# Patient Record
Sex: Male | Born: 1970 | Race: Black or African American | Hispanic: No | Marital: Married | Smoking: Never smoker
Health system: Southern US, Community
[De-identification: ages and names within clinical notes are randomized; demographics above are authoritative.]

## PROBLEM LIST (undated history)

## (undated) DIAGNOSIS — I10 Essential (primary) hypertension: Secondary | ICD-10-CM

## (undated) HISTORY — PX: HERNIA REPAIR: SHX51

---

## 2007-01-08 ENCOUNTER — Ambulatory Visit: Payer: Self-pay | Admitting: Emergency Medicine

## 2007-03-24 ENCOUNTER — Ambulatory Visit: Payer: Self-pay | Admitting: Family Medicine

## 2007-06-03 ENCOUNTER — Emergency Department: Payer: Self-pay | Admitting: Emergency Medicine

## 2008-10-31 ENCOUNTER — Ambulatory Visit: Payer: Self-pay | Admitting: Internal Medicine

## 2013-12-22 ENCOUNTER — Ambulatory Visit: Payer: Self-pay | Admitting: Family Medicine

## 2014-10-19 ENCOUNTER — Ambulatory Visit
Admission: EM | Admit: 2014-10-19 | Discharge: 2014-10-19 | Disposition: A | Discharge status: Home / Self Care | Payer: Federal, State, Local not specified - PPO | Attending: Family Medicine | Admitting: Family Medicine

## 2014-10-19 DIAGNOSIS — B349 Viral infection, unspecified: Secondary | ICD-10-CM | POA: Diagnosis not present

## 2014-10-19 DIAGNOSIS — R51 Headache: Secondary | ICD-10-CM | POA: Diagnosis not present

## 2014-10-19 DIAGNOSIS — J988 Other specified respiratory disorders: Secondary | ICD-10-CM

## 2014-10-19 DIAGNOSIS — H109 Unspecified conjunctivitis: Secondary | ICD-10-CM

## 2014-10-19 DIAGNOSIS — B9789 Other viral agents as the cause of diseases classified elsewhere: Secondary | ICD-10-CM

## 2014-10-19 DIAGNOSIS — I10 Essential (primary) hypertension: Secondary | ICD-10-CM | POA: Insufficient documentation

## 2014-10-19 DIAGNOSIS — R519 Headache, unspecified: Secondary | ICD-10-CM

## 2014-10-19 HISTORY — DX: Essential (primary) hypertension: I10

## 2014-10-19 LAB — RAPID STREP SCREEN (MED CTR MEBANE ONLY): Streptococcus, Group A Screen (Direct): NEGATIVE

## 2014-10-19 MED ORDER — KETOROLAC TROMETHAMINE 60 MG/2ML IM SOLN
60.0000 mg | Freq: Once | INTRAMUSCULAR | Status: AC
  Administered 2014-10-19: 60 mg via INTRAMUSCULAR

## 2014-10-19 MED ORDER — FLUTICASONE PROPIONATE 50 MCG/ACT NA SUSP: 1.0000 | Freq: Two times a day (BID) | NASAL | Status: AC

## 2014-10-19 MED ORDER — SALINE SPRAY 0.65 % NA SOLN: 1.0000 | NASAL | Status: AC | PRN

## 2014-10-19 MED ORDER — ACETAMINOPHEN 500 MG PO TABS: 500.0000 mg | ORAL_TABLET | Freq: Four times a day (QID) | ORAL | Status: AC | PRN

## 2014-10-19 NOTE — Discharge Instructions (Signed)
Hypertension °Hypertension, commonly called high blood pressure, is when the force of blood pumping through your arteries is too strong. Your arteries are the blood vessels that carry blood from your heart throughout your body. A blood pressure reading consists of a higher number over a lower number, such as 110/72. The higher number (systolic) is the pressure inside your arteries when your heart pumps. The lower number (diastolic) is the pressure inside your arteries when your heart relaxes. Ideally you want your blood pressure below 120/80. °Hypertension forces your heart to work harder to pump blood. Your arteries may become narrow or stiff. Having hypertension puts you at risk for heart disease, stroke, and other problems.  °RISK FACTORS °Some risk factors for high blood pressure are controllable. Others are not.  °Risk factors you cannot control include:  °· Race. You may be at higher risk if you are African American. °· Age. Risk increases with age. °· Gender. Men are at higher risk than women before age 45 years. After age 65, women are at higher risk than men. °Risk factors you can control include: °· Not getting enough exercise or physical activity. °· Being overweight. °· Getting too much fat, sugar, calories, or salt in your diet. °· Drinking too much alcohol. °SIGNS AND SYMPTOMS °Hypertension does not usually cause signs or symptoms. Extremely high blood pressure (hypertensive crisis) may cause headache, anxiety, shortness of breath, and nosebleed. °DIAGNOSIS  °To check if you have hypertension, your health care provider will measure your blood pressure while you are seated, with your arm held at the level of your heart. It should be measured at least twice using the same arm. Certain conditions can cause a difference in blood pressure between your right and left arms. A blood pressure reading that is higher than normal on one occasion does not mean that you need treatment. If one blood pressure reading  is high, ask your health care provider about having it checked again. °TREATMENT  °Treating high blood pressure includes making lifestyle changes and possibly taking medicine. Living a healthy lifestyle can help lower high blood pressure. You may need to change some of your habits. °Lifestyle changes may include: °· Following the DASH diet. This diet is high in fruits, vegetables, and whole grains. It is low in salt, red meat, and added sugars. °· Getting at least 2½ hours of brisk physical activity every week. °· Losing weight if necessary. °· Not smoking. °· Limiting alcoholic beverages. °· Learning ways to reduce stress. ° If lifestyle changes are not enough to get your blood pressure under control, your health care provider may prescribe medicine. You may need to take more than one. Work closely with your health care provider to understand the risks and benefits. °HOME CARE INSTRUCTIONS °· Have your blood pressure rechecked as directed by your health care provider.   °· Take medicines only as directed by your health care provider. Follow the directions carefully. Blood pressure medicines must be taken as prescribed. The medicine does not work as well when you skip doses. Skipping doses also puts you at risk for problems.   °· Do not smoke.   °· Monitor your blood pressure at home as directed by your health care provider.  °SEEK MEDICAL CARE IF:  °· You think you are having a reaction to medicines taken. °· You have recurrent headaches or feel dizzy. °· You have swelling in your ankles. °· You have trouble with your vision. °SEEK IMMEDIATE MEDICAL CARE IF: °· You develop a severe headache or confusion. °·   You have unusual weakness, numbness, or feel faint.  You have severe chest or abdominal pain.  You vomit repeatedly.  You have trouble breathing. MAKE SURE YOU:   Understand these instructions.  Will watch your condition.  Will get help right away if you are not doing well or get worse. Document  Released: 04/29/2005 Document Revised: 09/13/2013 Document Reviewed: 02/19/2013 [facility] Patient Information 2015 [place], [place]. This information is not intended to replace advice given to you by your health care provider. Make sure you discuss any questions you have with your health care provider. DASH Eating Plan DASH stands for "Dietary Approaches to Stop Hypertension." The DASH eating plan is a healthy eating plan that has been shown to reduce high blood pressure (hypertension). Additional health benefits may include reducing the risk of type 2 diabetes mellitus, heart disease, and stroke. The DASH eating plan may also help with weight loss. WHAT DO I NEED TO KNOW ABOUT THE DASH EATING PLAN? For the DASH eating plan, you will follow these general guidelines:  Choose foods with a percent daily value for sodium of less than 5% (as listed on the food label).  Use salt-free seasonings or herbs instead of table salt or sea salt.  Check with your health care provider or pharmacist before using salt substitutes.  Eat lower-sodium products, often labeled as "lower sodium" or "no salt added."  Eat fresh foods.  Eat more vegetables, fruits, and low-fat dairy products.  Choose whole grains. Look for the word "whole" as the first word in the ingredient list.  Choose fish and skinless chicken or [place] more often than red meat. Limit fish, poultry, and meat to 6 oz (170 g) each day.  Limit sweets, desserts, sugars, and sugary drinks.  Choose heart-healthy fats.  Limit cheese to 1 oz (28 g) per day.  Eat more home-cooked food and less restaurant, buffet, and fast food.  Limit fried foods.  Cook foods using methods other than frying.  Limit canned vegetables. If you do use them, rinse them well to decrease the sodium.  When eating at a restaurant, ask that your food be prepared with less salt, or no salt if possible. WHAT FOODS CAN I EAT? Seek help from a dietitian for individual  calorie needs. Grains Whole grain or whole wheat bread. Brown rice. Whole grain or whole wheat pasta. Quinoa, bulgur, and whole grain cereals. Low-sodium cereals. Corn or whole wheat flour tortillas. Whole grain cornbread. Whole grain crackers. Low-sodium crackers. Vegetables Fresh or frozen vegetables (raw, steamed, roasted, or grilled). Low-sodium or reduced-sodium tomato and vegetable juices. Low-sodium or reduced-sodium tomato sauce and paste. Low-sodium or reduced-sodium canned vegetables.  Fruits All fresh, canned (in natural juice), or frozen fruits. Meat and Other Protein Products Ground beef (85% or leaner), grass-fed beef, or beef trimmed of fat. Skinless chicken or [place]. Ground chicken or [place]. Pork trimmed of fat. All fish and seafood. Eggs. Dried beans, peas, or lentils. Unsalted nuts and seeds. Unsalted canned beans. Dairy Low-fat dairy products, such as skim or 1% milk, 2% or reduced-fat cheeses, low-fat ricotta or cottage cheese, or plain low-fat yogurt. Low-sodium or reduced-sodium cheeses. Fats and Oils Tub margarines without trans fats. Light or reduced-fat mayonnaise and salad dressings (reduced sodium). Avocado. Safflower, olive, or canola oils. Natural peanut or almond butter. Other Unsalted popcorn and pretzels. The items listed above may not be a complete list of recommended foods or beverages. Contact your dietitian for more options. WHAT FOODS ARE NOT RECOMMENDED? Grains White bread.  White pasta. White rice. Refined cornbread. Bagels and croissants. Crackers that contain trans fat. Vegetables Creamed or fried vegetables. Vegetables in a cheese sauce. Regular canned vegetables. Regular canned tomato sauce and paste. Regular tomato and vegetable juices. Fruits Dried fruits. Canned fruit in light or heavy syrup. Fruit juice. Meat and Other Protein Products Fatty cuts of meat. Ribs, chicken wings, bacon, sausage, bologna, salami, chitterlings, fatback, hot dogs,  bratwurst, and packaged luncheon meats. Salted nuts and seeds. Canned beans with salt. Dairy Whole or 2% milk, cream, half-and-half, and cream cheese. Whole-fat or sweetened yogurt. Full-fat cheeses or blue cheese. Nondairy creamers and whipped toppings. Processed cheese, cheese spreads, or cheese curds. Condiments Onion and garlic salt, seasoned salt, table salt, and sea salt. Canned and packaged gravies. Worcestershire sauce. Tartar sauce. Barbecue sauce. Teriyaki sauce. Soy sauce, including reduced sodium. Steak sauce. Fish sauce. Oyster sauce. Cocktail sauce. Horseradish. Ketchup and mustard. Meat flavorings and tenderizers. Bouillon cubes. Hot sauce. Tabasco sauce. Marinades. Taco seasonings. Relishes. Fats and Oils Butter, stick margarine, lard, shortening, ghee, and bacon fat. Coconut, palm kernel, or palm oils. Regular salad dressings. Other Pickles and olives. Salted popcorn and pretzels. The items listed above may not be a complete list of foods and beverages to avoid. Contact your dietitian for more information. WHERE CAN I FIND MORE INFORMATION? National Heart, Lung, and Blood Institute: [employer].itwww.nhlbi.nih.gov/health/health-topics/topics/dash/ Document Released: 04/18/2011 Document Revised: 09/13/2013 Document Reviewed: 03/03/2013 [facility] Patient Information 2015 [place], [place]. This information is not intended to replace advice given to you by your health care provider. Make sure you discuss any questions you have with your health care provider. Viral Infections A virus is a type of germ. Viruses can cause:  Minor sore throats.  Aches and pains.  Headaches.  Runny nose.  Rashes.  Watery eyes.  Tiredness.  Coughs.  Loss of appetite.  Feeling sick to your stomach (nausea).  Throwing up (vomiting).  Watery poop (diarrhea). HOME CARE   Only take medicines as told by your doctor.  Drink enough water and fluids to keep your pee (urine) clear or pale yellow. Sports  drinks are a good choice.  Get plenty of rest and eat healthy. Soups and broths with crackers or rice are fine. GET HELP RIGHT AWAY IF:   You have a very bad headache.  You have shortness of breath.  You have chest pain or neck pain.  You have an unusual rash.  You cannot stop throwing up.  You have watery poop that does not stop.  You cannot keep fluids down.  You or your child has a temperature by mouth above 102 F (38.9 C), not controlled by medicine.  Your baby is older than 3 months with a rectal temperature of 102 F (38.9 C) or higher.  Your baby is 523 months old or younger with a rectal temperature of 100.4 F (38 C) or higher. MAKE SURE YOU:   Understand these instructions.  Will watch this condition.  Will get help right away if you are not doing well or get worse. Document Released: 04/11/2008 Document Revised: 07/22/2011 Document Reviewed: 09/04/2010 [facility] Patient Information 2015 [place], [place]. This information is not intended to replace advice given to you by your health care provider. Make sure you discuss any questions you have with your health care provider. Sinus Headache A sinus headache is when your sinuses become clogged or swollen. Sinus headaches can range from mild to severe.  CAUSES A sinus headache can have different causes, such as:  Colds.  Sinus infections.  Allergies. SYMPTOMS  Symptoms of a sinus headache may vary and can include:  Headache.  Pain or pressure in the face.  Congested or runny nose.  Fever.  Inability to smell.  Pain in upper teeth. Weather changes can make symptoms worse. TREATMENT  The treatment of a sinus headache depends on the cause.  Sinus pain caused by a sinus infection may be treated with antibiotic medicine.  Sinus pain caused by allergies may be helped by allergy medicines (antihistamines) and medicated nasal sprays.  Sinus pain caused by congestion may be helped by flushing the nose and  sinuses with saline solution. HOME CARE INSTRUCTIONS   If antibiotics are prescribed, take them as directed. Finish them even if you start to feel better.  Only take over-the-counter or prescription medicines for pain, discomfort, or fever as directed by your caregiver.  If you have congestion, use a nasal spray to help reduce pressure. SEEK IMMEDIATE MEDICAL CARE IF:  You have a fever.  You have headaches more than once a week.  You have sensitivity to light or sound.  You have repeated nausea and vomiting.  You have vision problems.  You have sudden, severe pain in your face or head.  You have a seizure.  You are confused.  Your sinus headaches do not get better after treatment. Many people think they have a sinus headache when they actually have migraines or tension headaches. MAKE SURE YOU:   Understand these instructions.  Will watch your condition.  Will get help right away if you are not doing well or get worse. Document Released: 06/06/2004 Document Revised: 07/22/2011 Document Reviewed: 07/28/2010 [facility] Patient Information 2015 [place], [place]. This information is not intended to replace advice given to you by your health care provider. Make sure you discuss any questions you have with your health care provider. Conjunctivitis Conjunctivitis is commonly called "pink eye." Conjunctivitis can be caused by bacterial or viral infection, allergies, or injuries. There is usually redness of the lining of the eye, itching, discomfort, and sometimes discharge. There may be deposits of matter along the eyelids. A viral infection usually causes a watery discharge, while a bacterial infection causes a yellowish, thick discharge. Pink eye is very contagious and spreads by direct contact. You may be given antibiotic eyedrops as part of your treatment. Before using your eye medicine, remove all drainage from the eye by washing gently with warm water and cotton balls. Continue to  use the medication until you have awakened 2 mornings in a row without discharge from the eye. Do not rub your eye. This increases the irritation and helps spread infection. Use separate towels from other household members. Wash your hands with soap and water before and after touching your eyes. Use cold compresses to reduce pain and sunglasses to relieve irritation from light. Do not wear contact lenses or wear eye makeup until the infection is gone. SEEK MEDICAL CARE IF:   Your symptoms are not better after 3 days of treatment.  You have increased pain or trouble seeing.  The outer eyelids become very red or swollen. Document Released: 06/06/2004 Document Revised: 07/22/2011 Document Reviewed: 04/29/2005 [facility] Patient Information 2015 [place], [place]. This information is not intended to replace advice given to you by your health care provider. Make sure you discuss any questions you have with your health care provider.

## 2014-10-19 NOTE — ED Provider Notes (Addendum)
CSN: [id number]     Arrival date & time 10/19/14  1054 History   First MD Initiated Contact with Patient 10/19/14 1127     Chief Complaint  Patient presents with  . Headache   (Consider location/radiation/quality/duration/timing/severity/associated sxs/prior Treatment) HPI Comments: [place] male reported headache  X 4 days possible sinus problems tried sudafed, motrin , excedrin migraine, resting, flonase without complete resolution of symptoms.  Worst 10/10 first two days; best 2/10 last night and now 4/10 pain.  Headache location moving around head currently right forehead.  Woke up with neck pain this am 4/10 decreased now.  Reported usual BP 140/90 if he takes medications 150/100 if he didn't take his medications.  Eating regular meals.  Wife also has headache.  Denied seasonal allergies, travel.  Removed tick from body flat 2 weeks ago.  Patient is a 44 y.o. male presenting with headaches. The history is provided by the patient.  Headache Pain location:  Generalized Quality:  Dull Radiates to:  Does not radiate Severity currently:  4/10 Severity at highest:  10/10 Onset quality:  Gradual Duration:  4 days Timing:  Intermittent Progression:  Waxing and waning Chronicity:  New Similar to prior headaches: no   Context: not activity, not exposure to bright light, not caffeine, not coughing, not defecating, not eating, not stress, not exposure to cold air, not intercourse, not loud noise and not straining   Relieved by:  Aspirin, NSAIDs and resting in a darkened room Worsened by:  Activity Associated symptoms: congestion, nausea and neck pain   Associated symptoms: no abdominal pain, no back pain, no blurred vision, no cough, no diarrhea, no dizziness, no drainage, no ear pain, no eye pain, no facial pain, no fatigue, no fever, no focal weakness, no hearing loss, no loss of balance, no myalgias, no near-syncope, no neck stiffness, no numbness, no paresthesias, no photophobia,  no seizures, no sinus pressure, no sore throat, no swollen glands, no syncope, no tingling, no URI, no visual change, no vomiting and no weakness   Congestion:    Location:  Nasal   Interferes with sleep: no     Interferes with eating/drinking: no   Nausea:    Severity:  Mild   Onset quality:  Sudden   Duration:  1 day   Timing:  Rare   Progression:  Resolved Risk factors: no anger, no family hx of SAH, does not have insomnia and lifestyle not sedentary     Past Medical History  Diagnosis Date  . Hypertension    History reviewed. No pertinent past surgical history. Family History  Problem Relation Age of Onset  . Cancer Mother   . Cancer Father   . Hypertension Brother    History  Substance Use Topics  . Smoking status: Never Smoker   . Smokeless tobacco: Not on file  . Alcohol Use: No    Review of Systems  Constitutional: Negative for fever, chills, diaphoresis, activity change, appetite change and fatigue.  HENT: Positive for congestion and rhinorrhea. Negative for ear discharge, ear pain, facial swelling, hearing loss, mouth sores, nosebleeds, postnasal drip, sinus pressure, sneezing, sore throat, tinnitus, trouble swallowing and voice change.   Eyes: Negative for blurred vision, photophobia, pain, discharge, redness, itching and visual disturbance.  Respiratory: Negative for cough, choking, chest tightness, shortness of breath, wheezing and stridor.   Cardiovascular: Negative for chest pain, palpitations, leg swelling, syncope and near-syncope.  Gastrointestinal: Positive for nausea. Negative for vomiting, abdominal pain, diarrhea, constipation, blood in stool,  abdominal distention, anal bleeding and rectal pain.  Endocrine: Negative for cold intolerance and heat intolerance.  Genitourinary: Negative for frequency, hematuria, difficulty urinating and genital sores.  Musculoskeletal: Positive for neck pain. Negative for myalgias, back pain, joint swelling, arthralgias,  gait problem and neck stiffness.  Skin: Negative for color change, pallor, rash and wound.  Allergic/Immunologic: Negative for environmental allergies and food allergies.  Neurological: Positive for headaches. Negative for dizziness, tremors, focal weakness, seizures, syncope, facial asymmetry, speech difficulty, weakness, light-headedness, numbness, paresthesias and loss of balance.  Hematological: Negative for adenopathy. Does not bruise/bleed easily.  Psychiatric/Behavioral: Negative for behavioral problems, confusion, sleep disturbance and agitation.    Allergies  Lisinopril  Home Medications   Prior to Admission medications   Medication Sig Start Date End Date Taking? Authorizing Provider  hydrochlorothiazide (HYDRODIURIL) 25 MG tablet Take 25 mg by mouth daily.   Yes Historical Provider, MD  ibuprofen (ADVIL,MOTRIN) 400 MG tablet Take 400 mg by mouth every 6 (six) hours as needed.   Yes Historical Provider, MD  acetaminophen (TYLENOL) 500 MG tablet Take 1 tablet (500 mg total) by mouth every 6 (six) hours as needed for headache. 10/19/14   Barbaraann Barthel, NP  fluticasone (FLONASE) 50 MCG/ACT nasal spray Place 1 spray into both nostrils 2 (two) times daily. 10/19/14   Barbaraann Barthel, NP  sodium chloride (OCEAN) 0.65 % SOLN nasal spray Place 1 spray into both nostrils as needed for congestion. 10/19/14   Jarold Song Betancourt, NP   BP 140/89 mmHg  Pulse 79  Temp(Src) 98.2 F (36.8 C) (Oral)  Resp 16  Ht  (1.753 m)  Wt 200 lb (90.719 kg)  BMI 29.52 kg/m2  SpO2 100% Physical Exam  Constitutional: He is oriented to person, place, and time. Vital signs are normal. He appears well-developed and well-nourished. He is active.  Non-toxic appearance. He does not have a sickly appearance. He does not appear ill. No distress.  HENT:  Head: Normocephalic and atraumatic.  Right Ear: Hearing, tympanic membrane, external ear and ear canal normal.  Left Ear: Hearing, tympanic membrane,  external ear and ear canal normal.  Nose: Mucosal edema and rhinorrhea present. No nose lacerations, sinus tenderness, nasal deformity, septal deviation or nasal septal hematoma. No epistaxis.  No foreign bodies. Right sinus exhibits no maxillary sinus tenderness and no frontal sinus tenderness. Left sinus exhibits no maxillary sinus tenderness and no frontal sinus tenderness.  Mouth/Throat: Uvula is midline and mucous membranes are normal. He does not have dentures. No oral lesions. No trismus in the jaw. Normal dentition. No dental abscesses, uvula swelling, lacerations or dental caries. Posterior oropharyngeal edema and posterior oropharyngeal erythema present. No oropharyngeal exudate or tonsillar abscesses.  Bilateral TMs with slighty opacity air fluid level; dried mucous yellow nares turbinates nasal with edema/erythema; cobblestoning posterior pharynx; tonsils 3+ edema erythema bilaterally;   Eyes: EOM and lids are normal. Pupils are equal, round, and reactive to light. Right eye exhibits no chemosis, no discharge, no exudate and no hordeolum. No foreign body present in the right eye. Left eye exhibits no chemosis, no discharge, no exudate and no hordeolum. No foreign body present in the left eye. Right conjunctiva is injected. Right conjunctiva has no hemorrhage. Left conjunctiva is injected. Left conjunctiva has no hemorrhage. No scleral icterus. Right eye exhibits normal extraocular motion and no nystagmus. Left eye exhibits normal extraocular motion and no nystagmus. Right pupil is round and reactive. Left pupil is round and reactive. Pupils are equal.  2+ bilateral bulbar and eyelid conjunctive injection  Neck: Trachea normal and normal range of motion. Neck supple. No tracheal deviation present. No thyromegaly present.  Cardiovascular: Normal rate, regular rhythm, normal heart sounds and intact distal pulses.  Exam reveals no gallop and no friction rub.   No murmur heard. Pulmonary/Chest:  Effort normal and breath sounds normal. No stridor. No respiratory distress. He has no wheezes. He has no rales. He exhibits no tenderness.  Abdominal: Soft. Bowel sounds are normal. He exhibits no distension and no mass. There is no tenderness. There is no rebound and no guarding.  Musculoskeletal: Normal range of motion. He exhibits no edema or tenderness.  Lymphadenopathy:    He has no cervical adenopathy.  Neurological: He is alert and oriented to person, place, and time. Coordination normal.  Skin: Skin is warm, dry and intact. No rash noted. He is not diaphoretic. No pallor.  Psychiatric: He has a normal mood and affect. His speech is normal and behavior is normal. Judgment and thought content normal. Cognition and memory are normal.  Nursing note and vitals reviewed.   ED Course  Procedures (including critical care time) Labs Review Labs Reviewed  RAPID STREP SCREEN (NOT AT [facility])  CULTURE, GROUP A STREP ([facility] ONLY)   11:51 Medication Given LW  ketorolac (TORADOL) injection 60 mg - Dose: 60 mg ; Route: Intramuscular ; Site: Right Ventrogluteal ; Scheduled Time: 1200       Imaging Review No results found. Patient reported headache decreased to 1/10 after toradol discussed no motrin or aleve/naproxen for 8 hours  Work excuse x 24 hours.  Hydrate  Home to rest.  Discussed may use OTC refresh or get the red out eye gtts per manufacturer's instructions.   Hygiene discussed. Dispose of current contacts and case. Patient to apply warm packs prn as directed.  Instructed patient to not rub eyes.  May use over the counter eye drops/tears for pain/symptom relief.  Return to clinic if headache, fever greater than 100.5F, nausea/vomiting, purulent discharge/matting unable to open eye without using fingers, foreign body sensation, ciliary flush, worsening photophobia or vision.  Call or return to clinic as needed if these symptoms worsen or fail to improve as anticipated. Patient verbalized  agreement and understanding of treatment plan.   P2:  Hand washing  MDM   1. Viral respiratory illness   2. Bilateral conjunctivitis   3. Essential hypertension   4. Acute intractable headache, unspecified headache type    Patient notified rapid strep negative.  Throat culture results available in 48 hours.  Suspect Viral illness: no evidence of invasive bacterial infection, non toxic and well hydrated.  This is most likely self limiting viral infection.  I do not see where any further testing or imaging is necessary at this time.   I will suggest supportive care, rest, good hygiene and encourage the patient to take adequate fluids.  Does not require work excuse.  Notified patient staff will call with culture results once available next 48+ hours.  Benadryl/claritin/zyrtec po prn; flonase 1 spray each nostril BID prn, nasal saline 1-2 sprays each nostril prn q2h, tylenol 1000mg  po QID prn.  Discussed honey with lemon and salt water gargles for comfort also.  The patient is to return to clinic or EMERGENCY ROOM if symptoms worsen or change significantly e.g. fever, lethargy, SOB, wheezing.  Exitcare handout on viral illness given to patient.  For acute pain, rest, and intermittent application of heat, analgesics, and PRN po NSAIDS tylenol  preferred as does not raise BP.  Avoid known triggers e.g. sleep deprivation, foods, stress, dehydration.  If headache is the worst headache of entire life and came on like a clap of thunder patient was instructed to go to the Emergency Room.  Call or return to clinic as needed if these symptoms worsen or fail to improve as anticipated.  Exitcare handout on headaches given to patient and patient also instructed to maintain headache log.  Headache can be result of elevated blood pressure--has machine at home can check and if elevated follow up with us or PCM.  If has not taken blood pressure medication that day take as soon as remembers.    Patient verbalized agreement and  understanding of treatment plan.    Continue current medications as directed.  Continue to monitor blood pressure at home and maintain log of blood pressure and pulse to bring to follow up appointments.  Continue low sodium diet and exercise program.  Recommended weight loss/weight maintenance to BMI 20-25.  Return to the clinic if any new symptoms.  Patient verbalized agreement and understanding of treatment plan and had no further questions at this time.   P2:  Diet and Exercise specific for HTN   Barbaraann Barthelina A Betancourt, NP 10/19/14 1312  Barbaraann Barthelina A Betancourt, NP 10/19/14 1314  24 Oct 2014 at 1544 patient notified throat culture normal/negative.  Patient reported headache and symptoms resolved 22 Oct 2014.  He verbalized understanding of information and had no further questions at this time.  Barbaraann Barthelina A Betancourt, NP 10/24/14 1545

## 2014-10-19 NOTE — ED Notes (Signed)
Complaining of headache since Sunday. Nausea on first day. Denies fever.

## 2014-10-22 LAB — CULTURE, GROUP A STREP (THRC)

## 2016-04-19 ENCOUNTER — Other Ambulatory Visit: Payer: Self-pay | Admitting: Family Medicine

## 2016-04-19 DIAGNOSIS — R945 Abnormal results of liver function studies: Principal | ICD-10-CM

## 2016-04-19 DIAGNOSIS — R7989 Other specified abnormal findings of blood chemistry: Secondary | ICD-10-CM

## 2016-05-20 ENCOUNTER — Ambulatory Visit
Admission: RE | Admit: 2016-05-20 | Discharge: 2016-05-20 | Disposition: A | Discharge status: Home / Self Care | Payer: Federal, State, Local not specified - PPO | Source: Ambulatory Visit | Attending: Family Medicine | Admitting: Family Medicine

## 2016-05-20 DIAGNOSIS — R7989 Other specified abnormal findings of blood chemistry: Secondary | ICD-10-CM | POA: Diagnosis present

## 2016-05-20 DIAGNOSIS — R945 Abnormal results of liver function studies: Secondary | ICD-10-CM

## 2017-10-03 ENCOUNTER — Ambulatory Visit: Payer: Federal, State, Local not specified - PPO | Attending: Otolaryngology

## 2017-10-03 DIAGNOSIS — R5383 Other fatigue: Secondary | ICD-10-CM | POA: Insufficient documentation

## 2017-10-03 DIAGNOSIS — G4733 Obstructive sleep apnea (adult) (pediatric): Secondary | ICD-10-CM | POA: Insufficient documentation

## 2017-10-03 DIAGNOSIS — I10 Essential (primary) hypertension: Secondary | ICD-10-CM | POA: Insufficient documentation

## 2017-10-31 ENCOUNTER — Ambulatory Visit: Payer: Federal, State, Local not specified - PPO | Attending: Neurology

## 2017-10-31 DIAGNOSIS — G4733 Obstructive sleep apnea (adult) (pediatric): Secondary | ICD-10-CM | POA: Insufficient documentation

## 2017-12-11 IMAGING — US US ABDOMEN LIMITED
1 series · 14 of 25 positions shown · non-contrast
Comparison: None.

CLINICAL DATA: Elevated LFTs.

EXAM:
US ABDOMEN LIMITED - RIGHT UPPER QUADRANT

[Series 1: us abdomen limited · 0.22mm/px · 14 of 49 slices shown]
[im 1/49]
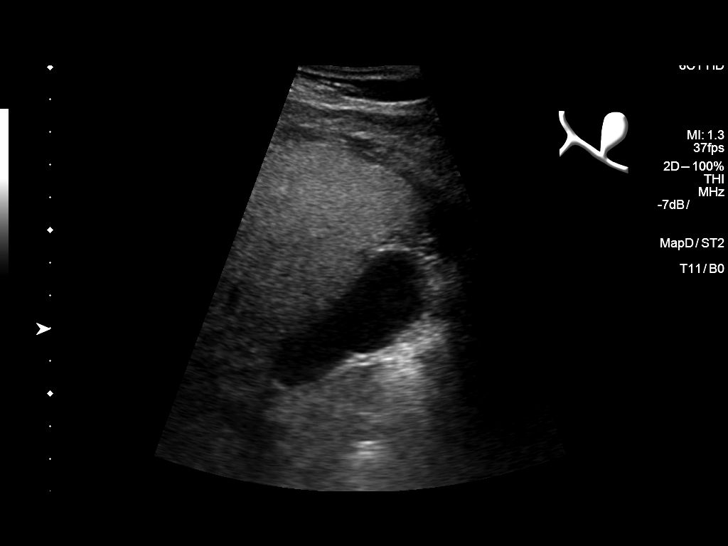
[im 5/49]
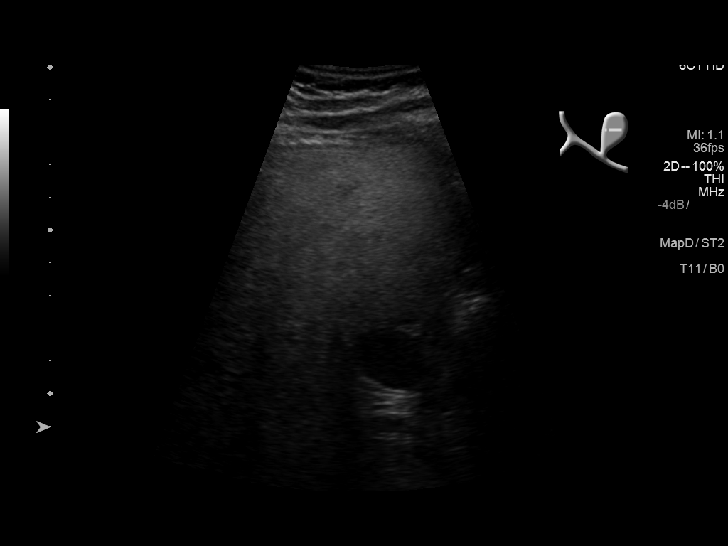
[im 9/49]
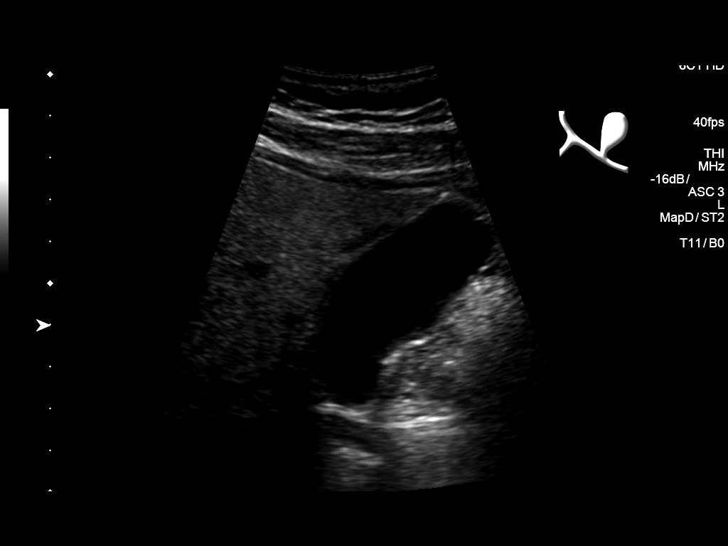
[im 13/49]
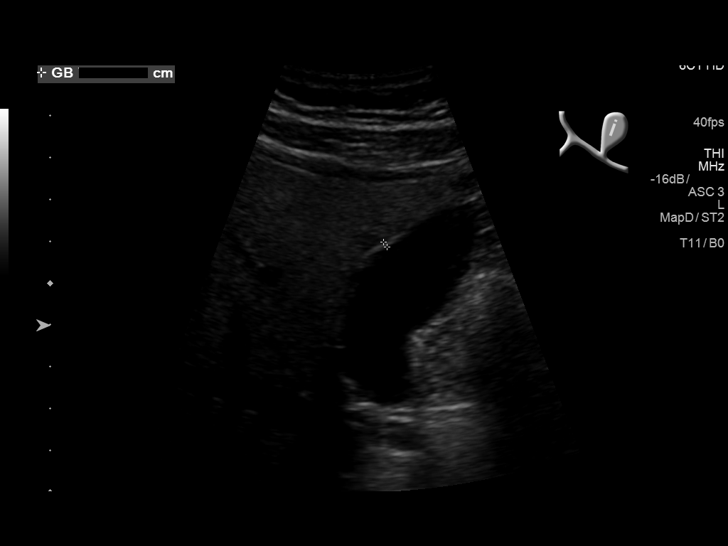
[im 17/49]
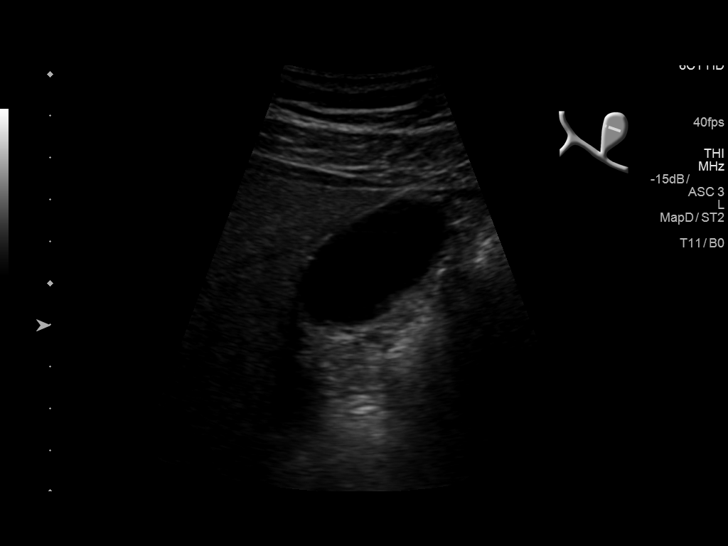
[im 19/49]
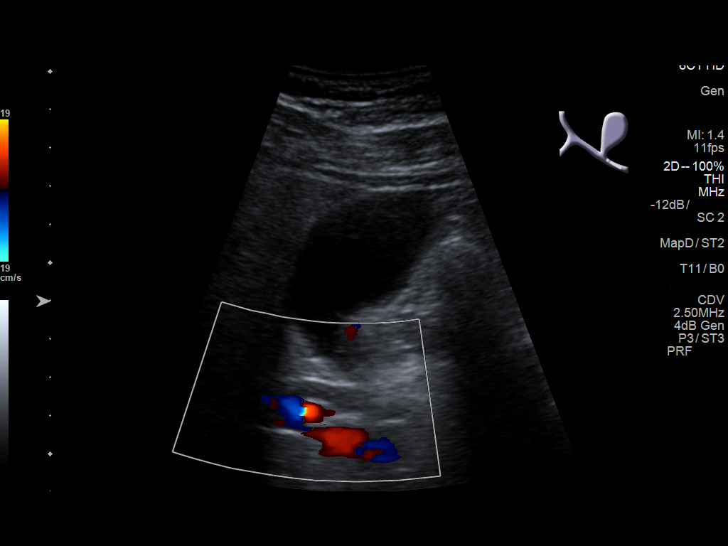
[im 23/49]
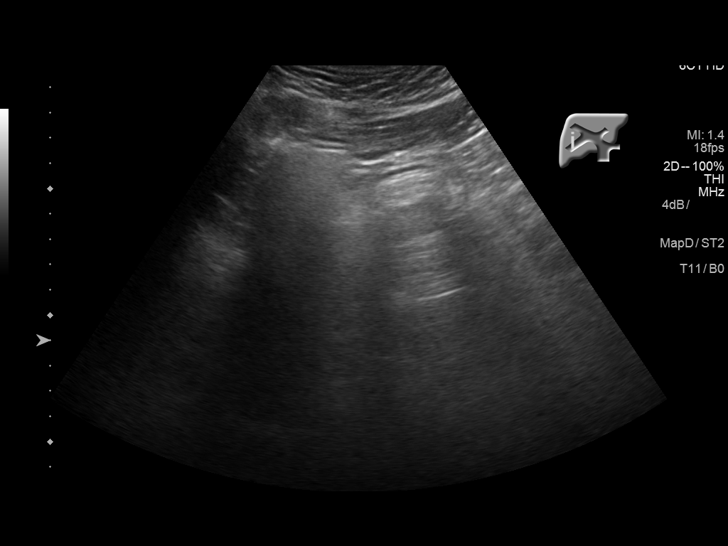
[im 27/49]
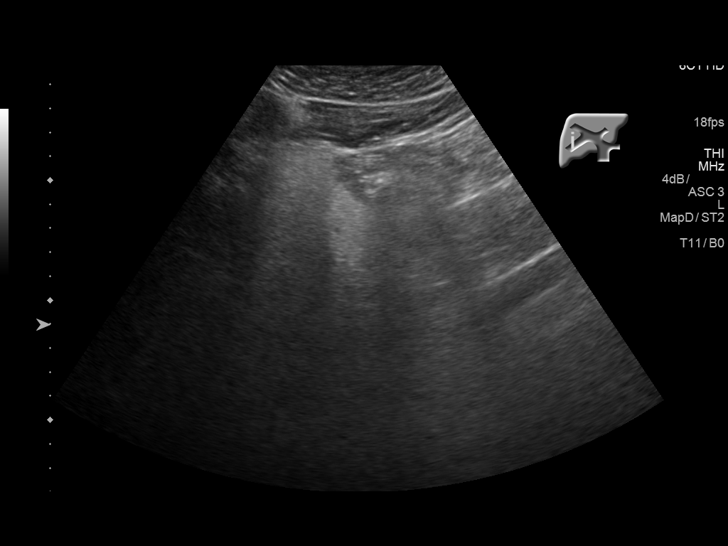
[im 31/49]
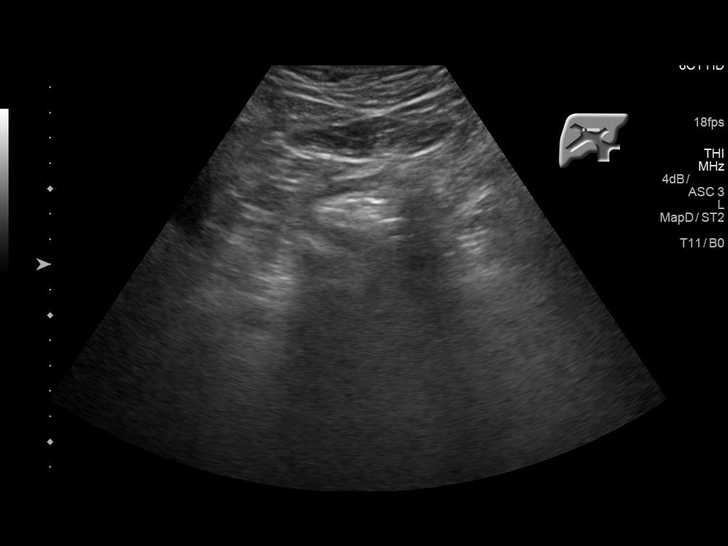
[im 33/49]
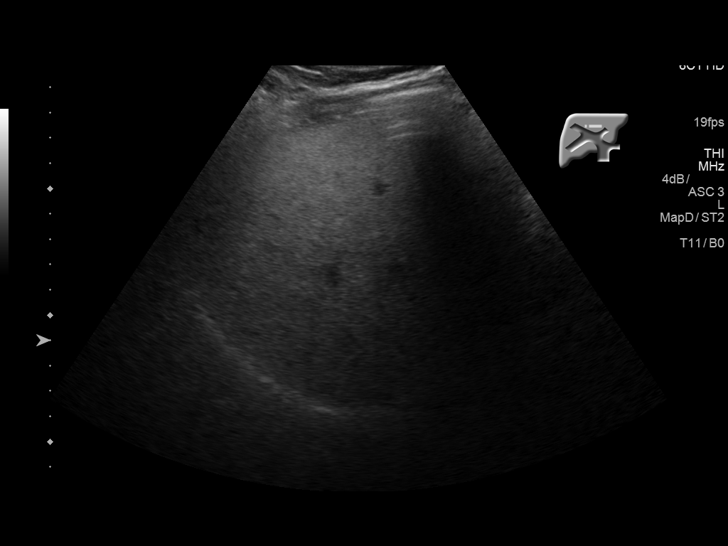
[im 37/49]
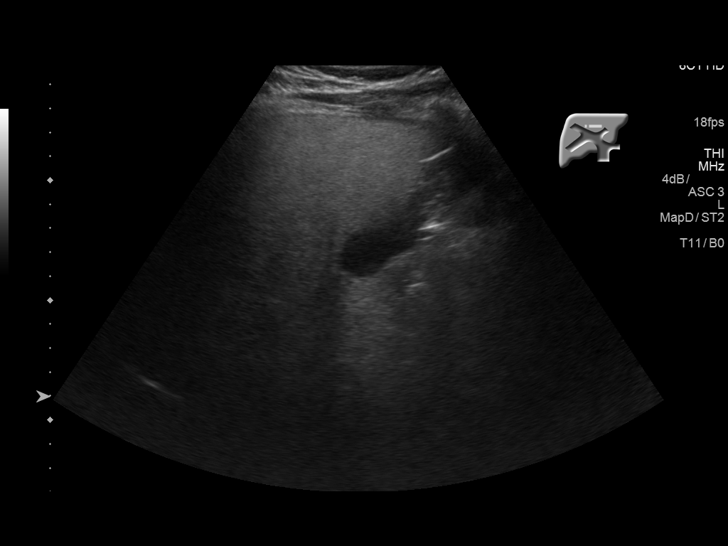
[im 41/49]
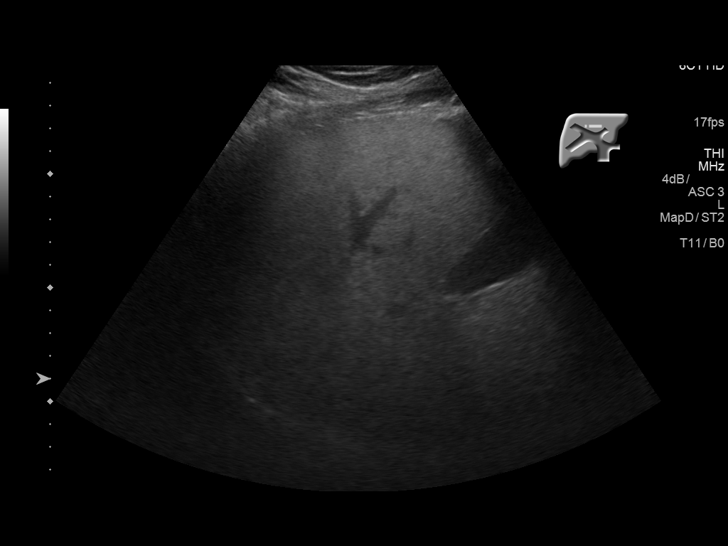
[im 45/49]
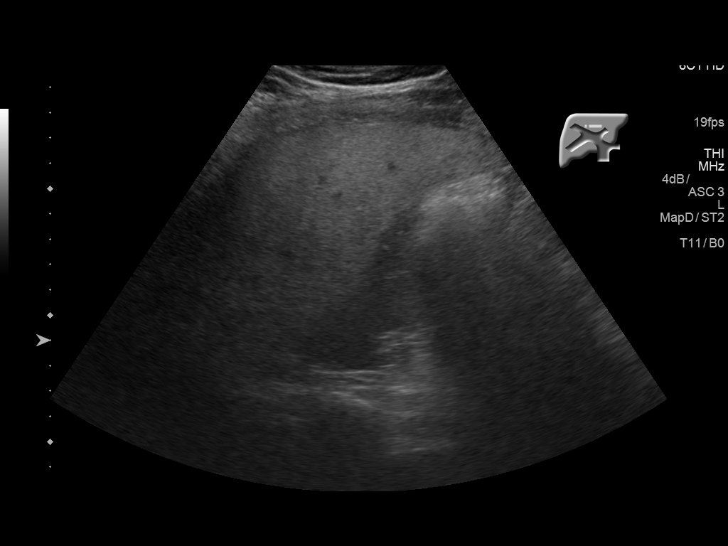
[im 49/49]
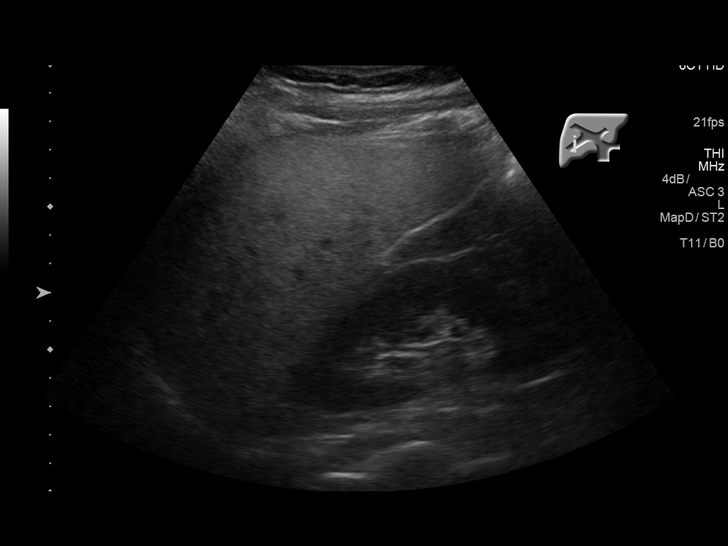

[14 of 25 positions shown; findings below may reference images not displayed]

FINDINGS: Gallbladder:

No gallstones or wall thickening visualized. No sonographic Murphy
sign noted by sonographer.

Common bile duct:

Diameter: 4.9 mm

Liver:

There is echogenic suggesting fatty infiltration and/or
hepatocellular disease. No focal hepatic abnormality identified.
Portal vein is patent.
IMPRESSION: Echogenic liver suggesting fatty infiltration and/or hepatocellular
disease.

## 2018-06-25 ENCOUNTER — Encounter: Payer: Self-pay | Admitting: Emergency Medicine

## 2018-06-25 ENCOUNTER — Ambulatory Visit
Admission: EM | Admit: 2018-06-25 | Discharge: 2018-06-25 | Disposition: A | Discharge status: Home / Self Care | Payer: Federal, State, Local not specified - PPO | Attending: Family Medicine | Admitting: Family Medicine

## 2018-06-25 ENCOUNTER — Other Ambulatory Visit: Payer: Self-pay

## 2018-06-25 DIAGNOSIS — R1013 Epigastric pain: Secondary | ICD-10-CM

## 2018-06-25 MED ORDER — PANTOPRAZOLE SODIUM 40 MG PO TBEC: 40.0000 mg | DELAYED_RELEASE_TABLET | Freq: Every day | ORAL | 1 refills | Status: AC

## 2018-06-25 NOTE — Discharge Instructions (Signed)
Medication as prescribed.  If persists, call GI.  Take care  Dr. Adriana Simasook

## 2018-06-25 NOTE — ED Triage Notes (Signed)
Patient c/o abdominal cramping that started Monday night. He states the pain is worse at night and he is unable to lay flat to sleep. He denies nausea,vomiting, diarrhea and fever.

## 2018-06-25 NOTE — ED Provider Notes (Signed)
[facility]    CSN: [id number] Arrival date & time: 06/25/18  1049  History   Chief Complaint Chief Complaint  Patient presents with  . Abdominal Pain   HPI  48 year old male presents with epigastric abdominal pain.  Patient states that he has a history of prior symptoms although it was much worse previously.  He has had prior colonoscopy and endoscopy.  He reports that his pain started Monday or Tuesday.  Pain is located in the epigastric region.  Described as a gnawing/crampy pain.  Worse at night.  Has been interfering with sleep.  No nausea, vomiting, diarrhea.  No hematochezia.  No melena.  He has been taking Pepto-Bismol as well as Mylanta without resolution.  Mild pain currently.  Improves briefly with food but then returns again.  Denies significant stress.  States that he uses NSAIDs approximately 2 times a month.  No alcohol.  No other associated symptoms.  No other complaints.  PMH, Surgical Hx, Family Hx, Social History reviewed and updated as below.  Past Medical History:  Diagnosis Date  . Hypertension    Past Surgical History:  Procedure Laterality Date  . HERNIA REPAIR     Home Medications    Prior to Admission medications   Medication Sig Start Date End Date Taking? Authorizing Provider  acetaminophen (TYLENOL) 500 MG tablet Take 1 tablet (500 mg total) by mouth every 6 (six) hours as needed for headache. 10/19/14  Yes Betancourt, Jarold Songina A, NP  fluticasone (FLONASE) 50 MCG/ACT nasal spray Place 1 spray into both nostrils 2 (two) times daily. 10/19/14  Yes Betancourt, Jarold Songina A, NP  hydrochlorothiazide (HYDRODIURIL) 25 MG tablet Take 25 mg by mouth daily.   Yes [provider]  sodium chloride (OCEAN) 0.65 % SOLN nasal spray Place 1 spray into both nostrils as needed for congestion. 10/19/14  Yes Betancourt, Jarold Songina A, NP  pantoprazole (PROTONIX) 40 MG tablet Take 1 tablet (40 mg total) by mouth daily. 06/25/18   Tommie Samsook, Harbor Paster G, DO    Family History Family  History  Problem Relation Age of Onset  . Cancer Mother   . Cancer Father   . Hypertension Brother     Social History Social History   Tobacco Use  . Smoking status: Never Smoker  . Smokeless tobacco: Never Used  Substance Use Topics  . Alcohol use: No  . Drug use: Not on file     Allergies   Lisinopril   Review of Systems Review of Systems  Constitutional: Negative for fever.  Gastrointestinal: Positive for abdominal pain. Negative for diarrhea, nausea and vomiting.   Physical Exam Triage Vital Signs ED Triage Vitals  Enc Vitals Group     BP 06/25/18 1058 (!) 160/96     Pulse Rate 06/25/18 1058 84     Resp 06/25/18 1058 18     Temp 06/25/18 1058 99.1 F (37.3 C)     Temp Source 06/25/18 1058 Oral     SpO2 06/25/18 1058 98 %     Weight 06/25/18 1057 206 lb (93.4 kg)     Height 06/25/18 1057 5\' 9"  (1.753 m)     Head Circumference --      Peak Flow --      Pain Score 06/25/18 1056 4     Pain Loc --      Pain Edu? --      Excl. in GC? --    Updated Vital Signs BP (!) 160/96 (BP Location: Right Arm)  Pulse 84   Temp 99.1 F (37.3 C) (Oral)   Resp 18   Ht 5\' 9"  (1.753 m)   Wt 93.4 kg   SpO2 98%   BMI 30.42 kg/m   Visual Acuity Right Eye Distance:   Left Eye Distance:   Bilateral Distance:    Right Eye Near:   Left Eye Near:    Bilateral Near:     Physical Exam Vitals signs and nursing note reviewed.  Constitutional:      General: He is not in acute distress.    Appearance: Normal appearance.  HENT:     Head: Normocephalic and atraumatic.  Eyes:     General:        Right eye: No discharge.        Left eye: No discharge.     Conjunctiva/sclera: Conjunctivae normal.  Cardiovascular:     Rate and Rhythm: Normal rate and regular rhythm.  Pulmonary:     Effort: Pulmonary effort is normal.     Breath sounds: Normal breath sounds.  Abdominal:     General: There is no distension.     Palpations: Abdomen is soft. There is no mass.      Tenderness: There is no abdominal tenderness.  Neurological:     Mental Status: He is alert.  Psychiatric:        Mood and Affect: Mood normal.        Behavior: Behavior normal.    UC Treatments / Results  Labs (all labs ordered are listed, but only abnormal results are displayed) Labs Reviewed - No data to display  EKG None  Radiology No results found.  Procedures Procedures (including critical care time)  Medications Ordered in UC Medications - No data to display  Initial Impression / Assessment and Plan / UC Course  I have reviewed the triage vital signs and the nursing notes.  Pertinent labs & imaging results that were available during my care of the patient were reviewed by me and considered in my medical decision making (see chart for details).    48 year old male presents with epigastric abdominal pain.  His exam is essentially unrevealing.  Possible peptic ulcer disease.  Placing on Protonix.  Advised to see GI if he fails to improve or worsens.  Final Clinical Impressions(s) / UC Diagnoses   Final diagnoses:  Abdominal pain, epigastric     Discharge Instructions     Medication as prescribed.  If persists, call GI.  Take care  Dr. Adriana Simasook    ED Prescriptions    Medication Sig Dispense Auth. Provider   pantoprazole (PROTONIX) 40 MG tablet Take 1 tablet (40 mg total) by mouth daily. 30 tablet Tommie Samsook, Raykwon Hobbs G, DO     Controlled Substance Prescriptions [place] Controlled Substance Registry consulted? Not Applicable   Tommie SamsCook, Nikash Mortensen G, DO 06/25/18 1202

## 2019-08-23 ENCOUNTER — Ambulatory Visit: Payer: Federal, State, Local not specified - PPO

## 2020-09-04 ENCOUNTER — Ambulatory Visit: Payer: Federal, State, Local not specified - PPO | Attending: Neurology

## 2020-09-04 DIAGNOSIS — Z9989 Dependence on other enabling machines and devices: Secondary | ICD-10-CM | POA: Insufficient documentation

## 2020-09-04 DIAGNOSIS — G4733 Obstructive sleep apnea (adult) (pediatric): Secondary | ICD-10-CM | POA: Insufficient documentation

## 2020-12-15 ENCOUNTER — Other Ambulatory Visit: Payer: Self-pay

## 2020-12-27 ENCOUNTER — Other Ambulatory Visit: Payer: Self-pay | Admitting: Family Medicine

## 2020-12-27 DIAGNOSIS — R222 Localized swelling, mass and lump, trunk: Secondary | ICD-10-CM

## 2021-01-09 ENCOUNTER — Other Ambulatory Visit: Payer: Self-pay

## 2021-01-09 ENCOUNTER — Ambulatory Visit
Admission: RE | Admit: 2021-01-09 | Discharge: 2021-01-09 | Disposition: A | Discharge status: Home / Self Care | Payer: Federal, State, Local not specified - PPO | Source: Ambulatory Visit | Attending: Family Medicine | Admitting: Family Medicine

## 2021-01-09 DIAGNOSIS — R222 Localized swelling, mass and lump, trunk: Secondary | ICD-10-CM | POA: Diagnosis present

## 2022-04-11 ENCOUNTER — Ambulatory Visit
Admission: RE | Admit: 2022-04-11 | Discharge: 2022-04-11 | Disposition: A | Discharge status: Home / Self Care | Payer: Commercial Managed Care - PPO | Source: Ambulatory Visit | Attending: Internal Medicine | Admitting: Internal Medicine

## 2022-04-11 VITALS — BP 136/93 | HR 91 | Temp 101.7°F | Resp 18 | Ht 68.0 in | Wt 208.0 lb

## 2022-04-11 DIAGNOSIS — J101 Influenza due to other identified influenza virus with other respiratory manifestations: Secondary | ICD-10-CM | POA: Diagnosis present

## 2022-04-11 DIAGNOSIS — Z1152 Encounter for screening for COVID-19: Secondary | ICD-10-CM | POA: Diagnosis not present

## 2022-04-11 LAB — RESP PANEL BY RT-PCR (FLU A&B, COVID) ARPGX2
Influenza A by PCR: POSITIVE — AB
Influenza B by PCR: NEGATIVE
SARS Coronavirus 2 by RT PCR: NEGATIVE

## 2022-04-11 MED ORDER — PROMETHAZINE-DM 6.25-15 MG/5ML PO SYRP: 5.0000 mL | ORAL_SOLUTION | Freq: Four times a day (QID) | ORAL | 0 refills | Status: AC | PRN

## 2022-04-11 MED ORDER — ACETAMINOPHEN 500 MG PO TABS
1000.0000 mg | ORAL_TABLET | Freq: Once | ORAL | Status: AC
  Administered 2022-04-11: 1000 mg via ORAL

## 2022-04-11 MED ORDER — XOFLUZA (80 MG DOSE) 1 X 80 MG PO TBPK: 80.0000 mg | ORAL_TABLET | Freq: Once | ORAL | 0 refills | Status: AC

## 2022-04-11 MED ORDER — IPRATROPIUM BROMIDE 0.06 % NA SOLN: 2.0000 | Freq: Four times a day (QID) | NASAL | 12 refills | Status: AC | PRN

## 2022-04-11 NOTE — ED Provider Notes (Signed)
[facility]    CSN: [id number] Arrival date & time: 04/11/22  1135      History   Chief Complaint Chief Complaint  Patient presents with   Fever   Cough    HPI Marc Reeves is a 51 y.o. male.   Patient is a 51 year old male who presents with flulike symptoms that began midday Tuesday.  Patient states his son was recently diagnosed with flu on Monday.  Patient reports a constant headache, mouth/throat dryness, thick mucus with cough that is white, generalized bodyaches, and alterations between hot and cold sensation.  Patient been taken ibuprofen and Tylenol.  He states his last dose of medication was this morning about 1 AM.    Past Medical History:  Diagnosis Date   Hypertension     There are no problems to display for this patient.   Past Surgical History:  Procedure Laterality Date   HERNIA REPAIR       Home Medications    Prior to Admission medications   Medication Sig Start Date End Date Taking? Authorizing Provider  acetaminophen (TYLENOL) 500 MG tablet Take 1 tablet (500 mg total) by mouth every 6 (six) hours as needed for headache. 10/19/14  Yes Betancourt, Jarold Song, NP  Baloxavir Marboxil,80 MG Dose, (XOFLUZA, 80 MG DOSE,) 1 x 80 MG TBPK Take 80 mg by mouth once for 1 dose. 04/11/22 04/11/22 Yes Candis Schatz, PA-C  fluticasone (FLONASE) 50 MCG/ACT nasal spray Place 1 spray into both nostrils 2 (two) times daily. 10/19/14  Yes Betancourt, Jarold Song, NP  hydrochlorothiazide (HYDRODIURIL) 25 MG tablet Take 25 mg by mouth daily.   Yes [provider]  ipratropium (ATROVENT) 0.06 % nasal spray Place 2 sprays into both nostrils 4 (four) times daily as needed for rhinitis (congestion, runny nose). 04/11/22  Yes Candis Schatz, PA-C  pantoprazole (PROTONIX) 40 MG tablet Take 1 tablet (40 mg total) by mouth daily. 06/25/18  Yes Cook, Jayce G, DO  promethazine-dextromethorphan (PROMETHAZINE-DM) 6.25-15 MG/5ML syrup Take 5 mLs by mouth 4  (four) times daily as needed for cough. 04/11/22  Yes Candis Schatz, PA-C  sodium chloride (OCEAN) 0.65 % SOLN nasal spray Place 1 spray into both nostrils as needed for congestion. 10/19/14  Yes Betancourt, Jarold Song, NP    Family History Family History  Problem Relation Age of Onset   Cancer Mother    Cancer Father    Hypertension Brother     Social History Social History   Tobacco Use   Smoking status: Never   Smokeless tobacco: Never  Vaping Use   Vaping Use: Former  Substance Use Topics   Alcohol use: No   Drug use: Never     Allergies   Lisinopril   Review of Systems Review of Systems as noted above in HPI.  Other systems reviewed and found to be negative   Physical Exam Triage Vital Signs ED Triage Vitals  Enc Vitals Group     BP 04/11/22 1209 (!) 136/93     Pulse Rate 04/11/22 1209 91     Resp 04/11/22 1209 18     Temp 04/11/22 1209 (!) 101.7 F (38.7 C)     Temp Source 04/11/22 1209 Oral     SpO2 04/11/22 1209 99 %     Weight 04/11/22 1207 208 lb (94.3 kg)     Height 04/11/22 1207 5\' 8"  (1.727 m)     Head Circumference --      Peak Flow --  Pain Score 04/11/22 1207 10     Pain Loc --      Pain Edu? --      Excl. in [place]? --    No data found.  Updated Vital Signs BP (!) 136/93 (BP Location: Left Arm)   Pulse 91   Temp (!) 101.7 F (38.7 C) (Oral)   Resp 18   Ht 5\' 8"  (1.727 m)   Wt 208 lb (94.3 kg)   SpO2 99%   BMI 31.63 kg/m     Physical Exam Constitutional:      Appearance: He is ill-appearing.  HENT:     Right Ear: A middle ear effusion is present. Tympanic membrane is bulging. Tympanic membrane is not erythematous.     Left Ear: A middle ear effusion is present. Tympanic membrane is bulging. Tympanic membrane is not erythematous.     Nose: Congestion present.     Mouth/Throat:     Mouth: Mucous membranes are moist.     Comments: Mild clear post nasal drainage Cardiovascular:     Rate and Rhythm: Normal rate.  Pulmonary:      Effort: Pulmonary effort is normal.     Breath sounds: No wheezing or rhonchi.     Comments: Cough Skin:    General: Skin is dry.     Comments: Hot  Neurological:     General: No focal deficit present.     Mental Status: He is alert and oriented to person, place, and time.      UC Treatments / Results  Labs (all labs ordered are listed, but only abnormal results are displayed) Labs Reviewed  RESP PANEL BY RT-PCR (FLU A&B, COVID) ARPGX2 - Abnormal; Notable for the following components:      Result Value   Influenza A by PCR POSITIVE (*)    All other components within normal limits    EKG   Radiology No results found.  Procedures Procedures (including critical care time)  Medications Ordered in UC Medications  acetaminophen (TYLENOL) tablet 1,000 mg (1,000 mg Oral Given 04/11/22 1238)    Initial Impression / Assessment and Plan / UC Course  I have reviewed the triage vital signs and the nursing notes.  Pertinent labs & imaging results that were available during my care of the patient were reviewed by me and considered in my medical decision making (see chart for details).    Patient presents with flulike symptoms that started on Tuesday around noon.  Patient states his son tested positive for flu on Monday.  Patient sinus feeling better after went to school today.  Patient with fever, body aches, productive cough, and persistent headache.  Patient fever in the clinic treated with acetaminophen.  Flu swab was positive for flu A.  Give patient prescription for Xofluza.  Also give him prescription for promethazine DM to help with sleep as well as Atrovent nasal spray for his congestion. Final Clinical Impressions(s) / UC Diagnoses   Final diagnoses:  None     Discharge Instructions       Take the Xofluza: one tablet once  Use the Atrovent nasal spray, 2 squirts up each nostril every 6 hours, as needed for nasal congestion and runny nose.  Use over-the-counter  Delsym, Zarbee's, or Robitussin during the day as needed for cough.  Use ibuprofen and Tylenol for pain and fever. You can alternate these medications every four hours  Use the Promethazine DM cough syrup at bedtime as will make you drowsy but it should  help dry up your postnasal drip and aid you in sleep and cough relief.  Return for reevaluation, or see your primary care provider, for new or worsening symptoms.      ED Prescriptions     Medication Sig Dispense Auth. Provider   ipratropium (ATROVENT) 0.06 % nasal spray Place 2 sprays into both nostrils 4 (four) times daily as needed for rhinitis (congestion, runny nose). 15 mL Candis Schatz, PA-C   promethazine-dextromethorphan (PROMETHAZINE-DM) 6.25-15 MG/5ML syrup Take 5 mLs by mouth 4 (four) times daily as needed for cough. 118 mL Candis Schatz, PA-C   Baloxavir Marboxil,80 MG Dose, (XOFLUZA, 80 MG DOSE,) 1 x 80 MG TBPK Take 80 mg by mouth once for 1 dose. 1 each Candis Schatz, PA-C      PDMP not reviewed this encounter.   Candis Schatz, PA-C 04/11/22 1310

## 2022-04-11 NOTE — Discharge Instructions (Addendum)
  Take the Xofluza: one tablet once  Use the Atrovent nasal spray, 2 squirts up each nostril every 6 hours, as needed for nasal congestion and runny nose.  Use over-the-counter Delsym, Zarbee's, or Robitussin during the day as needed for cough.  Use ibuprofen and Tylenol for pain and fever. You can alternate these medications every four hours  Use the Promethazine DM cough syrup at bedtime as will make you drowsy but it should help dry up your postnasal drip and aid you in sleep and cough relief.  Return for reevaluation, or see your primary care provider, for new or worsening symptoms.

## 2022-04-11 NOTE — ED Triage Notes (Signed)
Pt c/o cough, body chills temperature of 101, loss of appetite x4days  Pt states that he went to the urgent care on Monday for his son and is now having symptoms.

## 2022-06-30 ENCOUNTER — Ambulatory Visit
Admission: EM | Admit: 2022-06-30 | Discharge: 2022-06-30 | Disposition: A | Discharge status: Home / Self Care | Payer: Commercial Managed Care - PPO | Attending: Urgent Care | Admitting: Urgent Care

## 2022-06-30 DIAGNOSIS — J029 Acute pharyngitis, unspecified: Secondary | ICD-10-CM | POA: Diagnosis not present

## 2022-06-30 DIAGNOSIS — R6889 Other general symptoms and signs: Secondary | ICD-10-CM | POA: Diagnosis not present

## 2022-06-30 DIAGNOSIS — U071 COVID-19: Secondary | ICD-10-CM | POA: Diagnosis not present

## 2022-06-30 LAB — POCT RAPID STREP A (OFFICE): Rapid Strep A Screen: NEGATIVE

## 2022-06-30 MED ORDER — OSELTAMIVIR PHOSPHATE 75 MG PO CAPS: 75.0000 mg | ORAL_CAPSULE | Freq: Two times a day (BID) | ORAL | 0 refills | Status: AC

## 2022-06-30 MED ORDER — ACETAMINOPHEN 325 MG PO TABS
650.0000 mg | ORAL_TABLET | Freq: Once | ORAL | Status: AC
  Administered 2022-06-30: 650 mg via ORAL

## 2022-06-30 NOTE — ED Provider Notes (Signed)
[facility]    CSN: [id number] Arrival date & time: 06/30/22  1239      History   Chief Complaint Chief Complaint  Patient presents with   Headache   Sore Throat   Chills    HPI Huston Cristin Thaker is a 52 y.o. male.    Headache Sore Throat Associated symptoms include headaches.    Presents to urgent care with complaint of sore throat, chills, headache starting yesterday.  Patient has been using Coricidin.  Presents with elevated temperature of 100.5 F.  Past Medical History:  Diagnosis Date   Hypertension     There are no problems to display for this patient.   Past Surgical History:  Procedure Laterality Date   HERNIA REPAIR         Home Medications    Prior to Admission medications   Medication Sig Start Date End Date Taking? Authorizing Provider  acetaminophen (TYLENOL) 500 MG tablet Take 1 tablet (500 mg total) by mouth every 6 (six) hours as needed for headache. 10/19/14   Betancourt, Aura Fey, NP  fluticasone (FLONASE) 50 MCG/ACT nasal spray Place 1 spray into both nostrils 2 (two) times daily. 10/19/14   Betancourt, Aura Fey, NP  hydrochlorothiazide (HYDRODIURIL) 25 MG tablet Take 25 mg by mouth daily.    [provider]  ipratropium (ATROVENT) 0.06 % nasal spray Place 2 sprays into both nostrils 4 (four) times daily as needed for rhinitis (congestion, runny nose). 04/11/22   Luvenia Redden, PA-C  pantoprazole (PROTONIX) 40 MG tablet Take 1 tablet (40 mg total) by mouth daily. 06/25/18   Coral Spikes, DO  promethazine-dextromethorphan (PROMETHAZINE-DM) 6.25-15 MG/5ML syrup Take 5 mLs by mouth 4 (four) times daily as needed for cough. 04/11/22   Luvenia Redden, PA-C  sodium chloride (OCEAN) 0.65 % SOLN nasal spray Place 1 spray into both nostrils as needed for congestion. 10/19/14   Betancourt, Aura Fey, NP    Family History Family History  Problem Relation Age of Onset   Cancer Mother    Cancer Father    Hypertension Brother      Social History Social History   Tobacco Use   Smoking status: Never   Smokeless tobacco: Never  Vaping Use   Vaping Use: Former  Substance Use Topics   Alcohol use: No   Drug use: Never     Allergies   Aspirin, Citrullus vulgaris, Amlodipine, Lisinopril, Strawberry extract, Guaifenesin, and Naproxen sodium   Review of Systems Review of Systems  Neurological:  Positive for headaches.     Physical Exam Triage Vital Signs ED Triage Vitals  Enc Vitals Group     BP 06/30/22 1313 (!) 153/93     Pulse Rate 06/30/22 1313 82     Resp 06/30/22 1313 18     Temp 06/30/22 1313 (!) 100.5 F (38.1 C)     Temp Source 06/30/22 1313 Oral     SpO2 06/30/22 1313 95 %     Weight --      Height --      Head Circumference --      Peak Flow --      Pain Score 06/30/22 1306 6     Pain Loc --      Pain Edu? --      Excl. in [place]? --    No data found.  Updated Vital Signs BP (!) 153/93 (BP Location: Left Arm)   Pulse 82   Temp (!) 100.5 F (38.1  C) (Oral)   Resp 18   SpO2 95%   Visual Acuity Right Eye Distance:   Left Eye Distance:   Bilateral Distance:    Right Eye Near:   Left Eye Near:    Bilateral Near:     Physical Exam Vitals reviewed.  Constitutional:      Appearance: He is well-developed. He is ill-appearing.  HENT:     Mouth/Throat:     Pharynx: Oropharyngeal exudate and posterior oropharyngeal erythema present.     Tonsils: No tonsillar exudate.  Cardiovascular:     Rate and Rhythm: Normal rate and regular rhythm.     Heart sounds: Normal heart sounds.  Pulmonary:     Effort: Pulmonary effort is normal.     Breath sounds: Normal breath sounds.  Neurological:     Mental Status: He is alert and oriented to person, place, and time.  Psychiatric:        Mood and Affect: Mood normal.        Behavior: Behavior normal.      UC Treatments / Results  Labs (all labs ordered are listed, but only abnormal results are displayed) Labs Reviewed - No data  to display  EKG   Radiology No results found.  Procedures Procedures (including critical care time)  Medications Ordered in UC Medications  acetaminophen (TYLENOL) tablet 650 mg (650 mg Oral Given 06/30/22 1314)    Initial Impression / Assessment and Plan / UC Course  I have reviewed the triage vital signs and the nursing notes.  Pertinent labs & imaging results that were available during my care of the patient were reviewed by me and considered in my medical decision making (see chart for details).   Patient is febrile here without recent antipyretics. Acetaminophen given in clinic. Satting well on room air. Overall is ill appearing, well hydrated, without respiratory distress. Pulmonary exam is unremarkable.  Lungs CTAB without wheezing, rhonchi, rales.  Pharyngeal erythema is present but without peritonsillar exudates.  Rapid strep is negative.  Patient's symptoms are consistent with an acute viral process including influenza or COVID.  He is within the treatment window for flu and will presumptively treat with antiviral therapy whilst awaiting results of COVID swab.  Otherwise recommending use of OTC medication for symptom control.  Final Clinical Impressions(s) / UC Diagnoses   Final diagnoses:  None   Discharge Instructions   None    ED Prescriptions   None    PDMP not reviewed this encounter.   Rose Phi, [place] 06/30/22 1354

## 2022-06-30 NOTE — Discharge Instructions (Addendum)
You have been diagnosed with a viral upper respiratory infection based on your symptoms and exam. Viral illnesses cannot be treated with antibiotics - they are self limiting - and you should find your symptoms resolving within a few days. Get plenty of rest and non-caffeinated fluids. Watch for signs of dehydration including reduced urine output and dark colored urine.  I have prescribed Tamiflu, antiviral therapy for influenza A, based on a presumptive diagnosis of influenza.  We recommend you use over-the-counter medications for symptom control including acetaminophen (Tylenol), ibuprofen (Advil/Motrin) or naproxen (Aleve) for throat pain, fever, chills or body aches. You may combine use of acetaminophen and ibuprofen/naproxen if needed.  Some patients find an pain-relieving throat spray such as Chloraseptic to be effective.  Also recommend cold/cough medication containing a cough suppressant such as dextromethorphan, as needed. Please note that some cough medications are not recommended if you suffer from hypertension.    Saline mist spray is helpful for removing excess mucus from your nose.  Room humidifiers are helpful to ease breathing at night. I recommend guaifenesin (Mucinex) with plenty of water throughout the day to help thin and loosen mucus secretions in your respiratory passages.   If appropriate based upon your other medical problems, you might also find relief of nasal/sinus congestion symptoms by using a nasal decongestant such as fluticasone (Flonase ) or pseudoephedrine (Sudafed sinus).  You will need to obtain Sudafed from behind the pharmacist counter.  Speak to the pharmacist to verify that you are not duplicating medications with other over-the-counter formulations that you may be using.

## 2022-06-30 NOTE — ED Triage Notes (Signed)
Patient presents to UC for sore throat, chills, HA since yesterday. Taking coricidin cold/flu. Last dose 0600.

## 2022-07-01 LAB — SARS CORONAVIRUS 2 (TAT 6-24 HRS): SARS Coronavirus 2: POSITIVE — AB

## 2022-07-08 IMAGING — US US ABDOMEN LIMITED
1 series · 13 of 13 positions shown · non-contrast
Comparison: None.

CLINICAL DATA: Evaluate for lipoma. Mobile lump left lower back for
years starting to cause pain.

EXAM:
ULTRASOUND ABDOMEN LIMITED

[Series 1: us abdomen limited · 0.07mm/px · 13 of 13 slices shown]
[im 1/13]
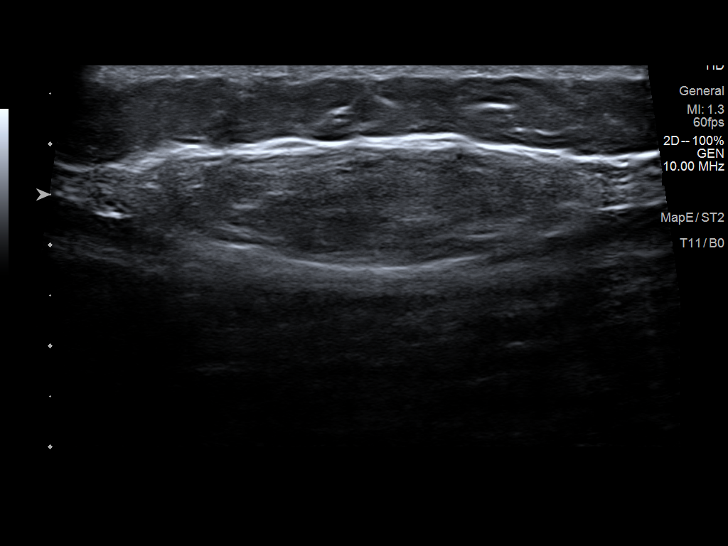
[im 2/13]
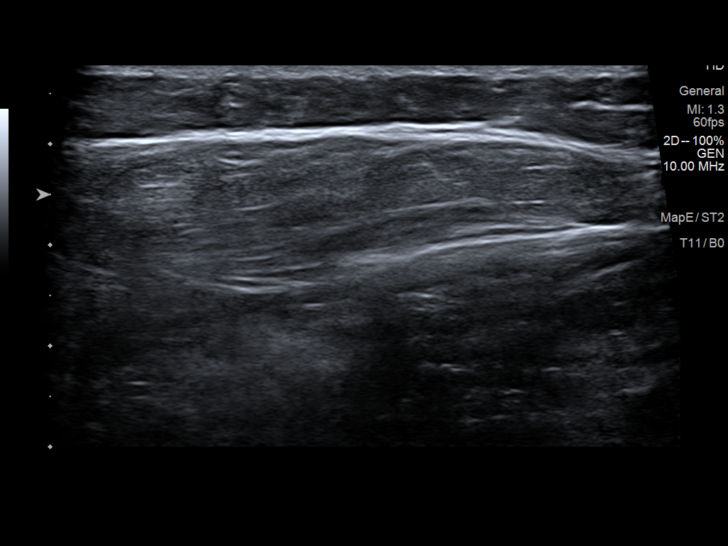
[im 3/13]
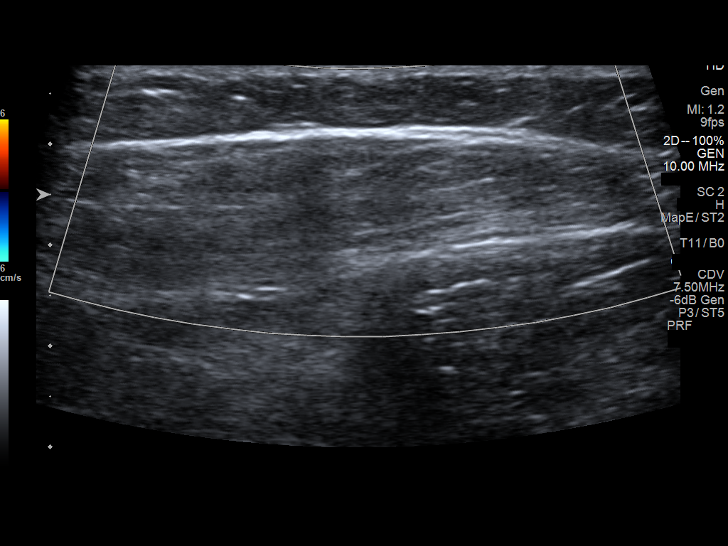
[im 4/13]
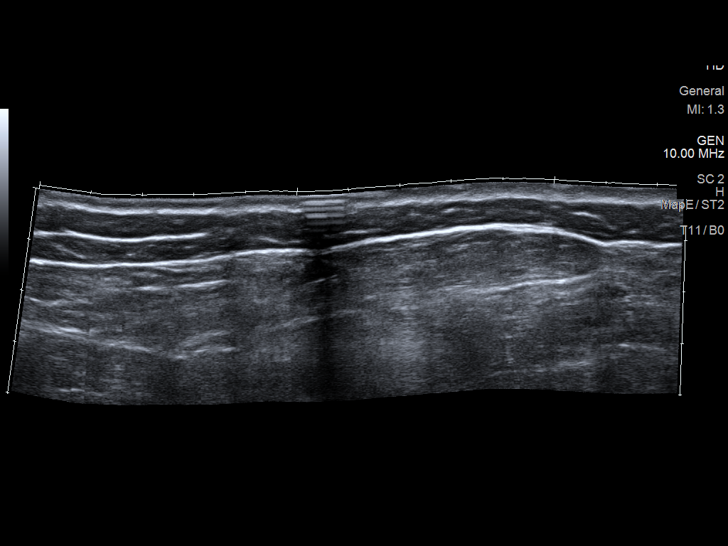
[im 5/13]
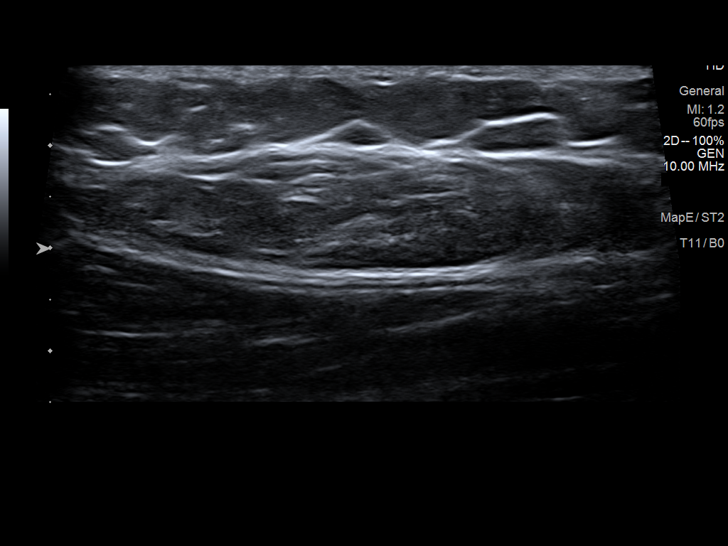
[im 6/13]
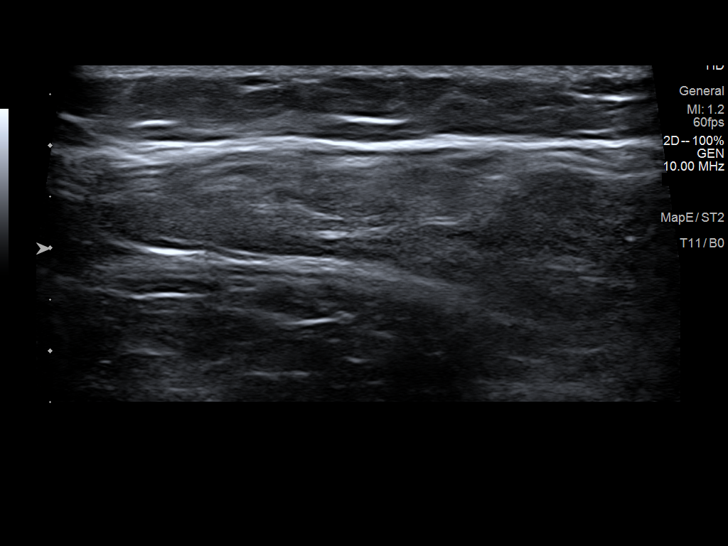
[im 7/13]
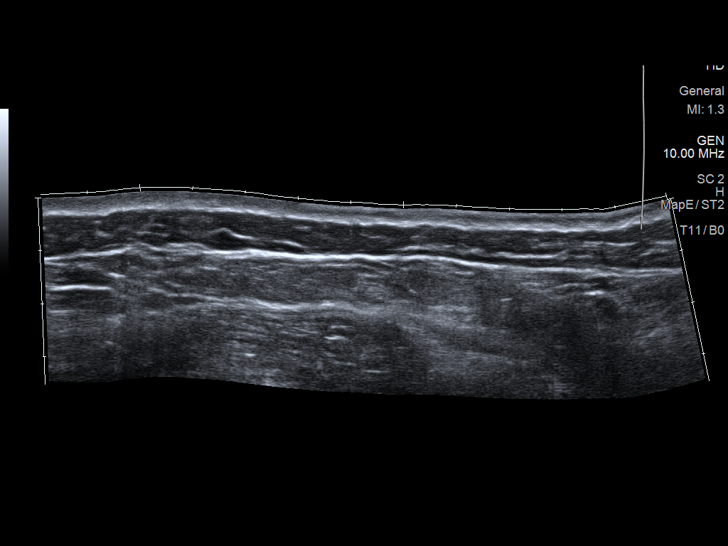
[im 8/13]
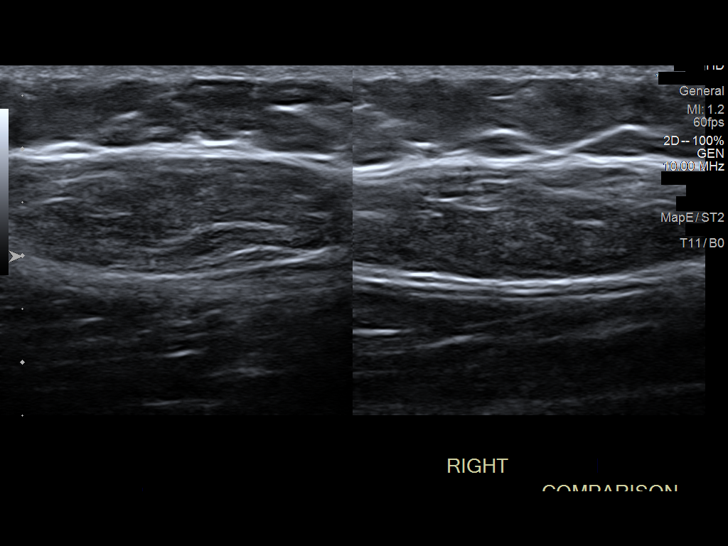
[im 9/13]
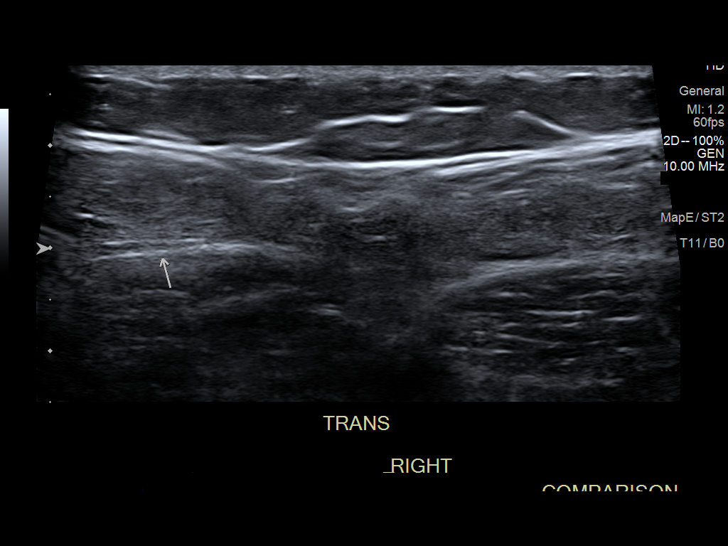
[im 10/13]
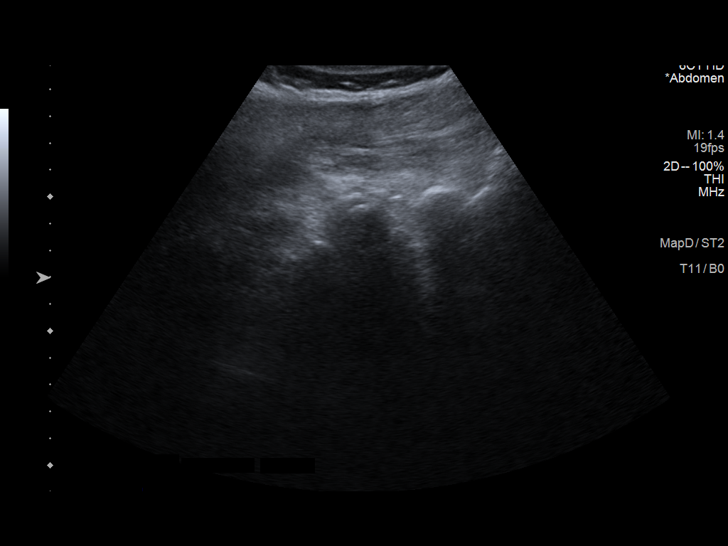
[im 11/13]
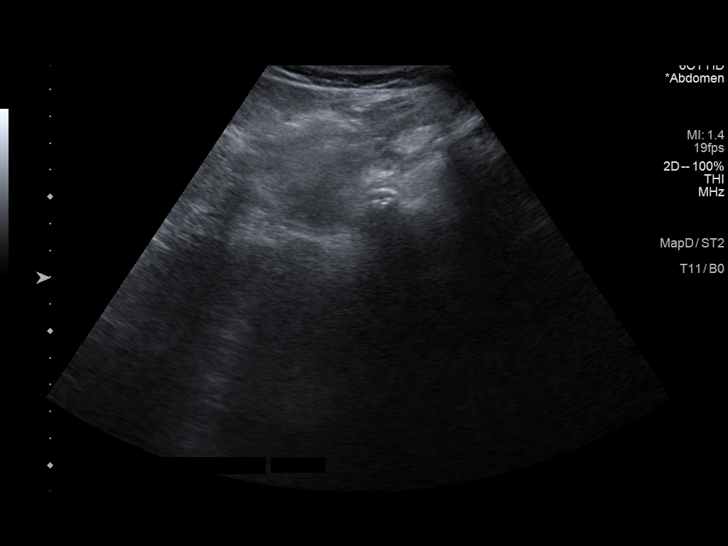
[im 12/13]
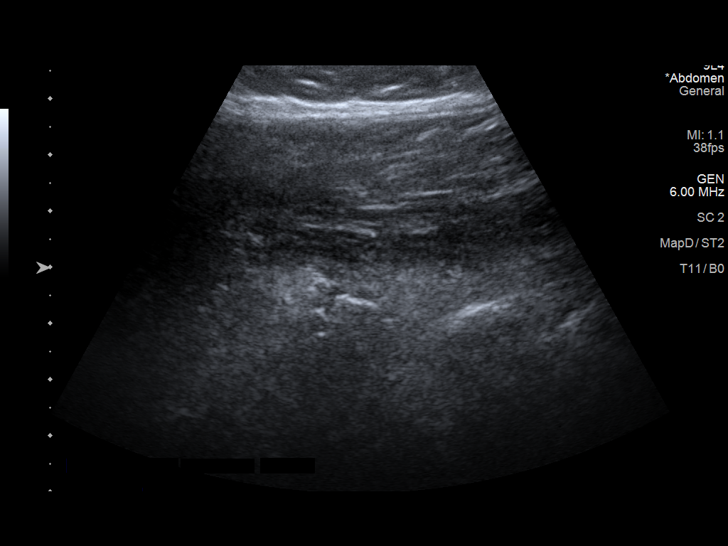
[im 13/13]
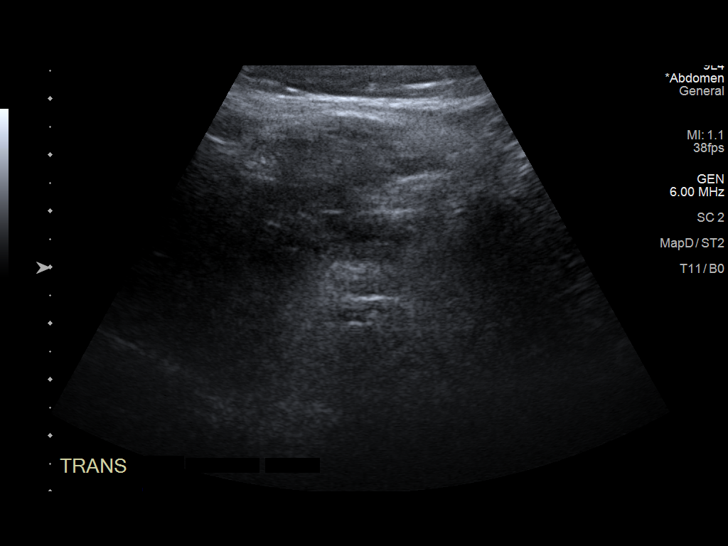

[13 of 13 positions shown; findings below may reference images not displayed]

FINDINGS: No mass or cystic lesion noted within the palpable area of concern
along the left midline sacral joint area.
IMPRESSION: No mass or cystic lesion of the palpable area of concern along the
left midline sacral joint area.
